# Patient Record
Sex: Male | Born: 1980 | Race: White | Hispanic: No | Marital: Married | Smoking: Never smoker
Health system: Southern US, Community
[De-identification: ages and names within clinical notes are randomized; demographics above are authoritative.]

## PROBLEM LIST (undated history)

## (undated) DIAGNOSIS — M7711 Lateral epicondylitis, right elbow: Secondary | ICD-10-CM

---
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
## 2019-01-01 ENCOUNTER — Ambulatory Visit (INDEPENDENT_AMBULATORY_CARE_PROVIDER_SITE_OTHER): Payer: BLUE CROSS/BLUE SHIELD | Admitting: Family Medicine

## 2019-01-01 ENCOUNTER — Ambulatory Visit (INDEPENDENT_AMBULATORY_CARE_PROVIDER_SITE_OTHER): Payer: Self-pay

## 2019-01-01 ENCOUNTER — Encounter (INDEPENDENT_AMBULATORY_CARE_PROVIDER_SITE_OTHER): Payer: Self-pay | Admitting: Family Medicine

## 2019-01-01 DIAGNOSIS — M25562 Pain in left knee: Secondary | ICD-10-CM | POA: Diagnosis not present

## 2019-01-01 DIAGNOSIS — M25561 Pain in right knee: Secondary | ICD-10-CM

## 2019-01-01 NOTE — Progress Notes (Signed)
   Office Visit Note   Patient: Bruce Braun           Date of Birth: 11-14-1981           MRN: [id number] Visit Date: 01/01/2019 Requested by: No referring provider defined for this encounter. PCP: Patient, No Pcp Per  Subjective: Chief Complaint  Patient presents with  . Right Knee - Pain    Knee started to hurt just a couple days ago, anterolateral aspect. NKI  . Left Knee - Pain    Pain since mid-December 2019 - woke him up middle of night. No specific injury. Got better with knee sleeve and Advil, but comes back every time he tries to work out.    HPI: He is a 38 year old with left greater than right knee pain.  His left knee started hurting in mid December.  He woke up one night with pain.  He did not have any pain the day before but since November he has been going to the gym trying to get in shape again.  He wonders whether some of his activities might have aggravated his knee.  He never had problems with his knee prior to this year.  His pain is mostly on the lateral aspect.  At rest he does not have any discomfort.  He has not noticed any swelling in the joint, locking or catching or popping.  It feels occasionally "wobbly".  His right knee started bothering him just a few days ago, minimal pain but he thought he would mention it.  It hurts on the anterior lateral aspect.              ROS: Otherwise noncontributory  Objective: Vital Signs: There were no vitals taken for this visit.  Physical Exam:  Right knee: No effusion, no patellofemoral crepitus.  Ligaments are stable.  Slight tenderness over the lateral/anterior joint line, no pain or click with McMurray's.  Left knee: No effusion, trace patellofemoral crepitus.  No pain with patella compression or apprehension.  Lockman's is solid, no laxity with varus/valgus stress.  Tender on the posterior lateral joint line with pain but no palpable click on McMurray's.   Imaging: X-rays both knees: Well-preserved joint spaces,  no definite stress fractures or loose bodies.  Possibly some very early lateral joint spurring the overall, bone structures look healthy.  Assessment & Plan: 1.  Left greater than right knee pain, possible lateral meniscus injuries. -Activity modification for the next 3 to 6 weeks.  If pain persists, then MRI of the left knee.  Otherwise follow-up as needed.   Follow-Up Instructions: No follow-ups on file.      Procedures: No procedures performed  No notes on file    PMFS History: There are no active problems to display for this patient.  History reviewed. No pertinent past medical history.  History reviewed. No pertinent family history.  History reviewed. No pertinent surgical history. Social History   Occupational History  . Not on file  Tobacco Use  . Smoking status: Never Smoker  . Smokeless tobacco: Never Used  Substance and Sexual Activity  . Alcohol use: Yes    Comment: 2 of any of the above per week  . Drug use: Yes    Types: Marijuana    Comment: infrequent  . Sexual activity: Not on file

## 2019-08-12 HISTORY — PX: WISDOM TOOTH EXTRACTION: SHX21

## 2019-10-13 ENCOUNTER — Other Ambulatory Visit: Payer: Self-pay

## 2019-10-13 DIAGNOSIS — Z20822 Contact with and (suspected) exposure to covid-19: Secondary | ICD-10-CM

## 2019-10-14 LAB — NOVEL CORONAVIRUS, NAA: SARS-CoV-2, NAA: NOT DETECTED

## 2021-08-11 DIAGNOSIS — Z8616 Personal history of COVID-19: Secondary | ICD-10-CM

## 2021-08-11 HISTORY — DX: Personal history of COVID-19: Z86.16

## 2021-08-16 ENCOUNTER — Other Ambulatory Visit: Payer: Self-pay | Admitting: Physician Assistant

## 2021-08-16 DIAGNOSIS — M25521 Pain in right elbow: Secondary | ICD-10-CM

## 2021-08-17 ENCOUNTER — Other Ambulatory Visit: Payer: Self-pay | Admitting: Physician Assistant

## 2021-08-17 ENCOUNTER — Other Ambulatory Visit: Payer: Self-pay

## 2021-08-17 ENCOUNTER — Ambulatory Visit
Admission: RE | Admit: 2021-08-17 | Discharge: 2021-08-17 | Disposition: A | Discharge status: Home / Self Care | Payer: BC Managed Care – PPO | Source: Ambulatory Visit | Attending: Physician Assistant | Admitting: Physician Assistant

## 2021-08-17 DIAGNOSIS — M25521 Pain in right elbow: Secondary | ICD-10-CM

## 2022-01-13 ENCOUNTER — Other Ambulatory Visit: Payer: Self-pay

## 2022-01-13 ENCOUNTER — Encounter (HOSPITAL_BASED_OUTPATIENT_CLINIC_OR_DEPARTMENT_OTHER): Payer: Self-pay | Admitting: Orthopedic Surgery

## 2022-01-13 NOTE — Progress Notes (Signed)
Spoke w/ via phone for pre-op interview--- pt Lab needs dos----  no (per anes)             Lab results------ no COVID test -----patient states asymptomatic no test needed Arrive at ------- 0745 on 01-19-2022 NPO after MN NO Solid Food.  Clear liquids from MN until--- 0645 Med rec completed Medications to take morning of surgery ----- none Diabetic medication ----- n/a Patient instructed no nail polish to be worn day of surgery Patient instructed to bring photo id and insurance card day of surgery Patient aware to have Driver (ride ) / caregiver for 24 hours after surgery ---wife, [place] Patient Special Instructions ----- n/a Pre-Op special Istructions ----- pre-op orders pending Patient verbalized understanding of instructions that were given at this phone interview. Patient denies shortness of breath, chest pain, fever, cough at this phone interview.

## 2022-01-16 NOTE — H&P (Signed)
Preoperative History & Physical Exam  Surgeon: Philipp Ovens, MD  Diagnosis: Right lateral epicondylitis  Planned Procedure: Procedure(s) (LRB): Right lateral epicondylitis debridement and repair (Right)  History of Present Illness:   Patient is a 41 y.o. male with symptoms consistent with Right lateral epicondylitis who presents for surgical intervention. The risks, benefits and alternatives of surgical intervention were discussed and informed consent was obtained prior to surgery.  Past Medical History:  Past Medical History:  Diagnosis Date   Epicondylitis, lateral, right    History of COVID-19 08/2021   per pt moderate symptoms that resolved    Past Surgical History:  Past Surgical History:  Procedure Laterality Date   TONSILLECTOMY  2001   WISDOM TOOTH EXTRACTION  08/2019    Medications:  Prior to Admission medications   Not on File    Allergies:  Patient has no known allergies.  Review of Systems: Negative except per HPI.  Physical Exam: Alert and oriented, NAD Head and neck: no masses, normal alignment CV: pulse intact Pulm: no increased work of breathing, respirations even and unlabored Abdomen: non-distended Extremities: extremities warm and well perfused  LABS: No results found for this or any previous visit (from the past 2160 hour(s)).   Complete History and Physical exam available in the office notes  Gomez Cleverly

## 2022-01-19 ENCOUNTER — Ambulatory Visit (HOSPITAL_BASED_OUTPATIENT_CLINIC_OR_DEPARTMENT_OTHER): Payer: BC Managed Care – PPO | Admitting: Anesthesiology

## 2022-01-19 ENCOUNTER — Encounter (HOSPITAL_BASED_OUTPATIENT_CLINIC_OR_DEPARTMENT_OTHER): Payer: Self-pay | Admitting: Orthopedic Surgery

## 2022-01-19 ENCOUNTER — Encounter (HOSPITAL_BASED_OUTPATIENT_CLINIC_OR_DEPARTMENT_OTHER)
Admission: RE | Disposition: A | Discharge status: Home / Self Care | Payer: Self-pay | Source: Home / Self Care | Attending: Orthopedic Surgery

## 2022-01-19 ENCOUNTER — Other Ambulatory Visit: Payer: Self-pay

## 2022-01-19 ENCOUNTER — Ambulatory Visit (HOSPITAL_BASED_OUTPATIENT_CLINIC_OR_DEPARTMENT_OTHER)
Admission: RE | Admit: 2022-01-19 | Discharge: 2022-01-19 | Disposition: A | Discharge status: Home / Self Care | Payer: BC Managed Care – PPO | Attending: Orthopedic Surgery | Admitting: Orthopedic Surgery

## 2022-01-19 DIAGNOSIS — M7711 Lateral epicondylitis, right elbow: Secondary | ICD-10-CM | POA: Diagnosis not present

## 2022-01-19 HISTORY — DX: Lateral epicondylitis, right elbow: M77.11

## 2022-01-19 HISTORY — PX: TENNIS ELBOW RELEASE/NIRSCHEL PROCEDURE: SHX6651

## 2022-01-19 SURGERY — TENNIS ELBOW RELEASE/NIRSCHEL PROCEDURE
Anesthesia: Monitor Anesthesia Care | Site: Elbow | Laterality: Right

## 2022-01-19 MED ORDER — ONDANSETRON HCL 4 MG/2ML IJ SOLN
INTRAMUSCULAR | Status: DC | PRN
  Administered 2022-01-19: 4 mg via INTRAVENOUS

## 2022-01-19 MED ORDER — FENTANYL CITRATE (PF) 100 MCG/2ML IJ SOLN
INTRAMUSCULAR | Status: DC | PRN
Start: 2022-01-19 — End: 2022-01-19
  Administered 2022-01-19: 50 ug via INTRAVENOUS

## 2022-01-19 MED ORDER — BUPIVACAINE HCL (PF) 0.5 % IJ SOLN
INTRAMUSCULAR | Status: DC | PRN
  Administered 2022-01-19: 10 mL

## 2022-01-19 MED ORDER — OXYCODONE HCL 5 MG PO TABS: 5.0000 mg | ORAL_TABLET | Freq: Once | ORAL | Status: DC | PRN

## 2022-01-19 MED ORDER — MIDAZOLAM HCL 2 MG/2ML IJ SOLN
INTRAMUSCULAR | Status: AC
  Filled 2022-01-19: qty 2

## 2022-01-19 MED ORDER — HYDROCODONE-ACETAMINOPHEN 5-325 MG PO TABS: 1.0000 | ORAL_TABLET | Freq: Four times a day (QID) | ORAL | 0 refills | Status: AC | PRN

## 2022-01-19 MED ORDER — PROPOFOL 10 MG/ML IV BOLUS
INTRAVENOUS | Status: DC | PRN
  Administered 2022-01-19: 40 mg via INTRAVENOUS
  Administered 2022-01-19: 20 mg via INTRAVENOUS

## 2022-01-19 MED ORDER — PROPOFOL 1000 MG/100ML IV EMUL
INTRAVENOUS | Status: AC
  Filled 2022-01-19: qty 100

## 2022-01-19 MED ORDER — ONDANSETRON HCL 4 MG/2ML IJ SOLN
INTRAMUSCULAR | Status: AC
  Filled 2022-01-19: qty 2

## 2022-01-19 MED ORDER — PROPOFOL 10 MG/ML IV BOLUS
INTRAVENOUS | Status: AC
  Filled 2022-01-19: qty 20

## 2022-01-19 MED ORDER — FENTANYL CITRATE (PF) 100 MCG/2ML IJ SOLN: 25.0000 ug | INTRAMUSCULAR | Status: DC | PRN

## 2022-01-19 MED ORDER — OXYCODONE HCL 5 MG/5ML PO SOLN: 5.0000 mg | Freq: Once | ORAL | Status: DC | PRN

## 2022-01-19 MED ORDER — CEFAZOLIN SODIUM-DEXTROSE 2-4 GM/100ML-% IV SOLN
2.0000 g | INTRAVENOUS | Status: AC
  Administered 2022-01-19: 2 g via INTRAVENOUS

## 2022-01-19 MED ORDER — MIDAZOLAM HCL 5 MG/5ML IJ SOLN
INTRAMUSCULAR | Status: DC | PRN
  Administered 2022-01-19: 1 mg via INTRAVENOUS

## 2022-01-19 MED ORDER — LIDOCAINE HCL (PF) 1 % IJ SOLN
INTRAMUSCULAR | Status: DC | PRN
  Administered 2022-01-19: 10 mL

## 2022-01-19 MED ORDER — KETOROLAC TROMETHAMINE 30 MG/ML IJ SOLN
INTRAMUSCULAR | Status: AC
  Filled 2022-01-19: qty 1

## 2022-01-19 MED ORDER — 0.9 % SODIUM CHLORIDE (POUR BTL) OPTIME
TOPICAL | Status: DC | PRN
  Administered 2022-01-19: 500 mL

## 2022-01-19 MED ORDER — KETOROLAC TROMETHAMINE 30 MG/ML IJ SOLN
INTRAMUSCULAR | Status: DC | PRN
Start: 2022-01-19 — End: 2022-01-19
  Administered 2022-01-19: 30 mg via INTRAVENOUS

## 2022-01-19 MED ORDER — LACTATED RINGERS IV SOLN: INTRAVENOUS | Status: DC

## 2022-01-19 MED ORDER — CEFAZOLIN SODIUM-DEXTROSE 2-4 GM/100ML-% IV SOLN
INTRAVENOUS | Status: AC
  Filled 2022-01-19: qty 100

## 2022-01-19 MED ORDER — FENTANYL CITRATE (PF) 100 MCG/2ML IJ SOLN
INTRAMUSCULAR | Status: AC
  Filled 2022-01-19: qty 2

## 2022-01-19 MED ORDER — ONDANSETRON HCL 4 MG/2ML IJ SOLN: 4.0000 mg | Freq: Once | INTRAMUSCULAR | Status: DC | PRN

## 2022-01-19 MED ORDER — PROPOFOL 500 MG/50ML IV EMUL
INTRAVENOUS | Status: DC | PRN
  Administered 2022-01-19: 75 ug/kg/min via INTRAVENOUS

## 2022-01-19 SURGICAL SUPPLY — 37 items
BLADE SURG 15 STRL LF DISP TIS (BLADE) ×1 IMPLANT
BLADE SURG 15 STRL SS (BLADE) ×1
BNDG ELASTIC 4X5.8 VLCR STR LF (GAUZE/BANDAGES/DRESSINGS) ×2 IMPLANT
BNDG ESMARK 4X9 LF (GAUZE/BANDAGES/DRESSINGS) ×2 IMPLANT
CORD BIPOLAR FORCEPS 12FT (ELECTRODE) ×1 IMPLANT
COVER BACK TABLE 60X90IN (DRAPES) ×2 IMPLANT
CUFF TOURN SGL QUICK 18X4 (TOURNIQUET CUFF) ×2 IMPLANT
DECANTER SPIKE VIAL GLASS SM (MISCELLANEOUS) IMPLANT
DERMABOND ADVANCED (GAUZE/BANDAGES/DRESSINGS) ×1
DERMABOND ADVANCED .7 DNX12 (GAUZE/BANDAGES/DRESSINGS) IMPLANT
DRAPE EXTREMITY T 121X128X90 (DISPOSABLE) ×2 IMPLANT
DRAPE SHEET LG 3/4 BI-LAMINATE (DRAPES) ×2 IMPLANT
DRSG EMULSION OIL 3X3 NADH (GAUZE/BANDAGES/DRESSINGS) ×2 IMPLANT
GAUZE 4X4 16PLY ~~LOC~~+RFID DBL (SPONGE) ×2 IMPLANT
GAUZE SPONGE 4X4 12PLY STRL (GAUZE/BANDAGES/DRESSINGS) ×2 IMPLANT
GAUZE SPONGE 4X4 12PLY STRL LF (GAUZE/BANDAGES/DRESSINGS) ×1 IMPLANT
GLOVE SURG UNDER POLY LF SZ7.5 (GLOVE) ×2 IMPLANT
GOWN STRL REUS W/TWL LRG LVL3 (GOWN DISPOSABLE) ×2 IMPLANT
K-WIRE .045X4 (WIRE) ×1 IMPLANT
KIT TURNOVER CYSTO (KITS) ×2 IMPLANT
NEEDLE HYPO 22GX1.5 SAFETY (NEEDLE) ×2 IMPLANT
NS IRRIG 500ML POUR BTL (IV SOLUTION) ×2 IMPLANT
PACK BASIN DAY SURGERY FS (CUSTOM PROCEDURE TRAY) ×2 IMPLANT
PAD CAST 4YDX4 CTTN HI CHSV (CAST SUPPLIES) ×2 IMPLANT
PADDING CAST COTTON 4X4 STRL (CAST SUPPLIES) ×1
STRIP CLOSURE SKIN 1/2X4 (GAUZE/BANDAGES/DRESSINGS) ×1 IMPLANT
SUT BONE WAX W31G (SUTURE) IMPLANT
SUT ETHILON 4 0 PS 2 18 (SUTURE) ×2 IMPLANT
SUT MNCRL AB 3-0 PS2 18 (SUTURE) ×1 IMPLANT
SUT VIC AB 3-0 FS2 27 (SUTURE) ×1 IMPLANT
SUT VIC AB 4-0 PS2 27 (SUTURE) ×2 IMPLANT
SYR 10ML LL (SYRINGE) ×1 IMPLANT
SYR BULB EAR ULCER 3OZ GRN STR (SYRINGE) ×2 IMPLANT
TAPE SURG TRANSPORE 1 IN (GAUZE/BANDAGES/DRESSINGS) ×1 IMPLANT
TAPE SURGICAL TRANSPORE 1 IN (GAUZE/BANDAGES/DRESSINGS) ×1
TOWEL OR 17X26 10 PK STRL BLUE (TOWEL DISPOSABLE) ×2 IMPLANT
UNDERPAD 30X36 HEAVY ABSORB (UNDERPADS AND DIAPERS) ×2 IMPLANT

## 2022-01-19 NOTE — Anesthesia Preprocedure Evaluation (Signed)
Anesthesia Evaluation  Patient identified by MRN, date of birth, ID band Patient awake    Reviewed: Allergy & Precautions, NPO status , Patient's Chart, lab work & pertinent test results  Airway Mallampati: II  TM Distance: >3 FB Neck ROM: Full    Dental no notable dental hx. (+) Teeth Intact   Pulmonary neg pulmonary ROS,    Pulmonary exam normal breath sounds clear to auscultation       Cardiovascular negative cardio ROS Normal cardiovascular exam Rhythm:Regular Rate:Normal     Neuro/Psych negative neurological ROS  negative psych ROS   GI/Hepatic negative GI ROS, Neg liver ROS,   Endo/Other  negative endocrine ROS  Renal/GU negative Renal ROS  negative genitourinary   Musculoskeletal  (+) Arthritis , Right lateral epicondylitis   Abdominal   Peds  Hematology negative hematology ROS (+)   Anesthesia Other Findings   Reproductive/Obstetrics                             Anesthesia Physical Anesthesia Plan  ASA: 1  Anesthesia Plan: MAC   Post-op Pain Management:    Induction: Intravenous  PONV Risk Score and Plan: 1 and Treatment may vary due to age or medical condition, Propofol infusion and Ondansetron  Airway Management Planned: Natural Airway, Simple Face Mask and Nasal Cannula  Additional Equipment:   Intra-op Plan:   Post-operative Plan:   Informed Consent: I have reviewed the patients History and Physical, chart, labs and discussed the procedure including the risks, benefits and alternatives for the proposed anesthesia with the patient or authorized representative who has indicated his/her understanding and acceptance.     Dental advisory given  Plan Discussed with: CRNA and Anesthesiologist  Anesthesia Plan Comments:         Anesthesia Quick Evaluation

## 2022-01-19 NOTE — Discharge Instructions (Signed)
°  Orthopaedic Hand Surgery Discharge Instructions ° °WEIGHT BEARING STATUS: Non weight bearing on operative extremity ° °INCISION CARE: Keep dressing over your incision clean and dry until 5 days after surgery. You may shower by placing a waterproof covering over your dressing. Once dressing is removed, you may allow water to run over the incision and then place Band-Aids over incision. Do not scrub your incision or apply creams/lotions. Do not submerge your incision or swim for 3 weeks after surgery. Contact your surgeon or primary care doctor if you develop redness or drainage from your incision.  ° °PAIN CONTROL: First line medications for post operative pain control are Tylenol (acetaminophen) and Motrin (ibuprofen) if you are able to take these medications. If you have been prescribed a medication these can be taken as breakthrough pain medications. Please note that some narcotic pain medication has acetaminophen added and you should never consume more than 4,000mg of acetaminophen in 24-hour period. Please note that if you are given Toradol (ketorolac) you should not take similar medications such as ibuprofen or naproxen. ° °DISCHARGE MEDICATIONS: If you have been prescribed medication it was sent electronically to your pharmacy. No changes have been made to your home medications. ° °ICE/ELEVATION: Ice and elevate your injured extremity as needed. Avoid direct contact of ice with skin.  ° °BANDAGE FEELS TOO TIGHT: If your bandage feels too tight, first make sure you are elevating your fingers as much as possible. The outer layer of the bandage can be unwrapped and reapplied more loosely. If no improvement, you may carefully cut the inner layer longitudinally until the pressure has resolved and then rewrap the outer layer. If you are not comfortable with these instructions, please call the office and the bandage can be changed for you.  ° °FOLLOW UP: You will be called after surgery with an appointment date and  time, however if you have not received a phone call within 3 days, please call during regular office hours at [phone] to schedule a post operative appointment. ° °Please Seek Medical Attention if: °Call MD for: pain or pressure in chest, jaw, arm, back, neck  °Call MD for: temperature greater than 101 F for more than 24 hrs °Call MD for: difficulty breathing °Call MD for: incision redness, bleeding, drainage  °Call MD for: palpitations or feeling that the heart is racing  °Call MD for: increased swelling in arm, leg, ankle, or abdomen  °Call MD for: lightheadedness, dizziness, fainting °Call 911 or go to ER for any medical emergency if you are not able to get in touch with your doctor ° ° °J. Reid Calyn Sivils, MD °Orthopaedic Hand Surgeon °[facility] °Office number: [phone] °[address] °[place], [place] [address] ° ° °

## 2022-01-19 NOTE — Anesthesia Postprocedure Evaluation (Signed)
Anesthesia Post Note  Patient: Bruce Braun  Procedure(s) Performed: Right lateral epicondylitis debridement and repair (Right: Elbow)     Patient location during evaluation: PACU Anesthesia Type: MAC Level of consciousness: awake and alert and oriented Pain management: pain level controlled Vital Signs Assessment: post-procedure vital signs reviewed and stable Respiratory status: spontaneous breathing, nonlabored ventilation and respiratory function stable Cardiovascular status: stable and blood pressure returned to baseline Postop Assessment: no apparent nausea or vomiting Anesthetic complications: no   No notable events documented.  Last Vitals:  Vitals:   01/19/22 0813 01/19/22 0930  BP: 121/76 114/80  Pulse: 79 73  Resp: 17 17  Temp: 36.8 C 36.6 C  SpO2: 98% 100%    Last Pain:  Vitals:   01/19/22 0930  TempSrc:   PainSc: 2                  Brithney Bensen,Lyncoln A.

## 2022-01-19 NOTE — Transfer of Care (Signed)
Immediate Anesthesia Transfer of Care Note  Patient: Bruce Braun  Procedure(s) Performed: Procedure(s) (LRB): Right lateral epicondylitis debridement and repair (Right)  Patient Location: PACU phase 2   Anesthesia Type: MAC  Level of Consciousness: awake, sedated, patient cooperative and responds to stimulation  Airway & Oxygen Therapy: Patient Spontanous Breathing and Patient on RA soft FM   Post-op Assessment: Report given to PACU RN, Post -op Vital signs reviewed and stable and Patient moving all extremities  Post vital signs: Reviewed and stable  Complications: No apparent anesthesia complications

## 2022-01-19 NOTE — Interval H&P Note (Signed)
History and Physical Interval Note:  01/19/2022 8:22 AM  Bruce Braun  has presented today for surgery, with the diagnosis of Right lateral epicondylitis.  The various methods of treatment have been discussed with the patient and family. After consideration of risks, benefits and other options for treatment, the patient has consented to  Procedure(s): Right lateral epicondylitis debridement and repair (Right) as a surgical intervention.  The patient's history has been reviewed, patient examined, no change in status, stable for surgery.  I have reviewed the patient's chart and labs.  Questions were answered to the patient's satisfaction.     Orene Desanctis

## 2022-01-19 NOTE — Op Note (Signed)
OPERATIVE NOTE  DATE OF PROCEDURE: 01/19/2022  SURGEONS:  Primary: Orene Desanctis, MD  PREOPERATIVE DIAGNOSIS: Right lateral epicondylitis  POSTOPERATIVE DIAGNOSIS: Same  NAME OF PROCEDURE:   Right elbow lateral epicondylitis debridement and repair  ANESTHESIA: Monitor Anesthesia Care + Local  SKIN PREPARATION: Hibiclens  ESTIMATED BLOOD LOSS: Minimal  IMPLANTS: none  INDICATIONS:  Bruce Braun is a 41 y.o. male who has the above preoperative diagnosis. The patient has decided to proceed with surgical intervention.  Risks, benefits and alternatives of operative management were discussed including, but not limited to, risks of anesthesia complications, infection, pain, persistent symptoms, stiffness, need for future surgery.  The patient understands, agrees and elects to proceed with surgery.    DESCRIPTION OF PROCEDURE: The patient was met in the pre-operative area and their identity was verified.  The operative location and laterality was also verified and marked.  The patient was brought to the OR and was placed supine on the table.  After repeat patient identification with the operative team anesthesia was provided and the patient was prepped and draped in the usual sterile fashion.  A final timeout was performed verifying the correction patient, procedure, location and laterality.  Preoperative antibiotics were provided and then the right upper extremity was elevated and exsanguinated with an Esmarch and tourniquet inflated to 250 mmHg.  An oblique incision was made over the lateral elbow just anterior to the prominence of the lateral condyle.  Skin and subcutaneous tissues were divided down to the fascia.  Careful hemostasis was obtained with bipolar electrocautery.  The interval between the extensor longus and extensor aponeurosis was identified and a longitudinal incision was made.  Careful dissection was made down to the extensor carpi radialis brevis.  There was significantly diseased,  friable, tendinotic tissue identified and this tissue was excised in its entirety.  This was sent to pathology as a specimen.  At this time the area just anterior to the lateral epicondyle was debrided with a curette to healthy cancellous bleeding bone and a 0.045 inch K wire was utilized to stimulate healing response in this area.  The wound was irrigated and was closed with buried 3-0 Vicryl suture in interrupted fashion with excellent watertight closure of the extensor interval.  The subcutaneous tissue was closed with 3-0 buried subcuticular Monocryl suture followed by Dermabond and Steri-Strips.  A sterile soft bandage was applied and the tourniquet was deflated.  The fingers were pink and warm and well-perfused at the end of the procedure.  All counts were correct x2.  The patient tolerated the procedure well and was brought to PACU for recovery in stable condition.  Begin therapy 1 week postop, wrist brace, postop lateral epi protocol. F/u path.   Bruce Holmes, MD

## 2022-01-20 ENCOUNTER — Encounter (HOSPITAL_BASED_OUTPATIENT_CLINIC_OR_DEPARTMENT_OTHER): Payer: Self-pay | Admitting: Orthopedic Surgery

## 2022-01-20 LAB — SURGICAL PATHOLOGY

## 2023-07-21 IMAGING — MR MR ELBOW*R* W/O CM
4 of 5 series · 18 of 40 positions shown · non-contrast
Comparison: None.

CLINICAL DATA: Lateral right elbow pain, tennis injury.

EXAM:
MRI OF THE RIGHT ELBOW WITHOUT CONTRAST
TECHNIQUE: Multiplanar, multisequence MR imaging of the elbow was performed. No
intravenous contrast was administered.

[Series 5: T1 · axial · right · 3.0mm · 0.16mm/px · z∈[-50,+23]mm · 3 of 27 slices shown]
[im 4/27]
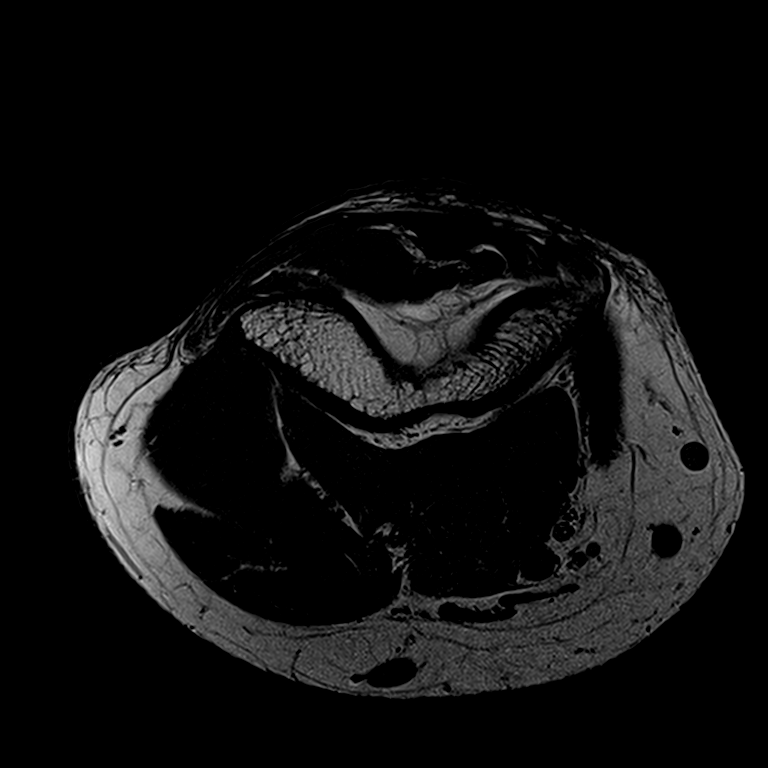
[im 14/27]
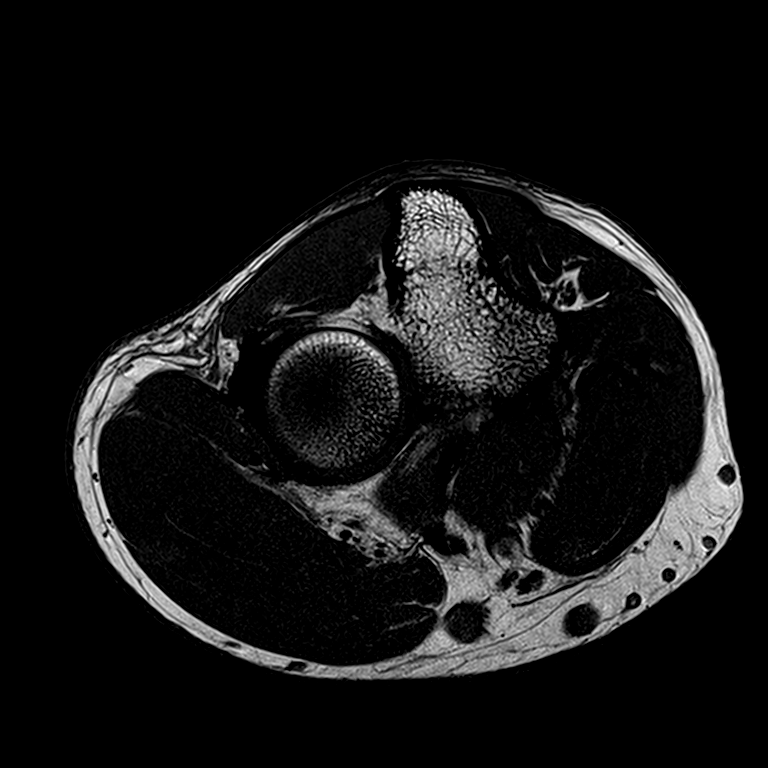
[im 23/27]
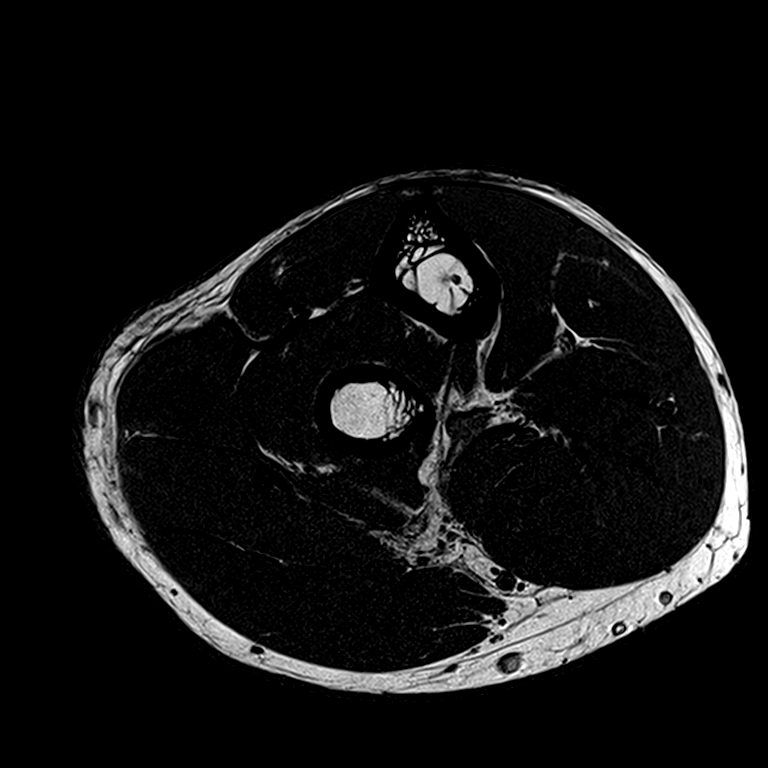

[Series 6: T2 fat-sat · axial · right · 3.0mm · 0.31mm/px · z∈[-61,+38]mm · 9 of 27 slices shown (1 of 3)]
[im 1/27]
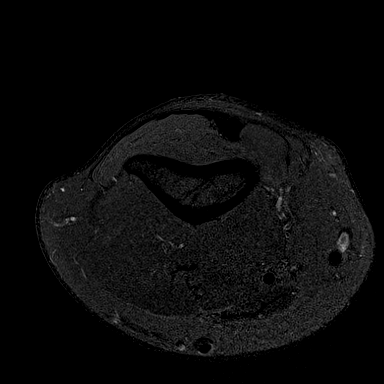
[im 4/27]
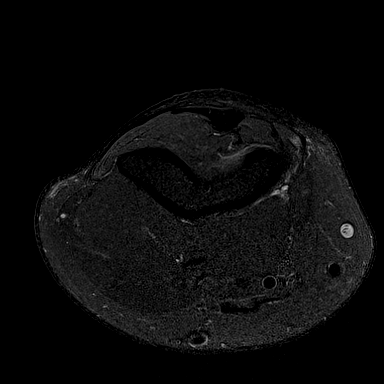
[im 7/27]
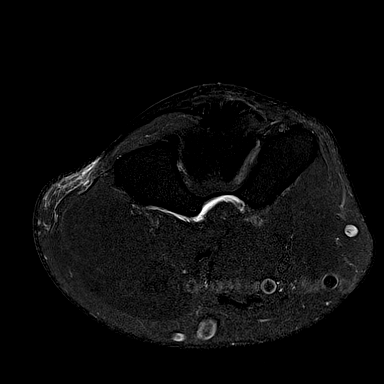
[im 10/27]
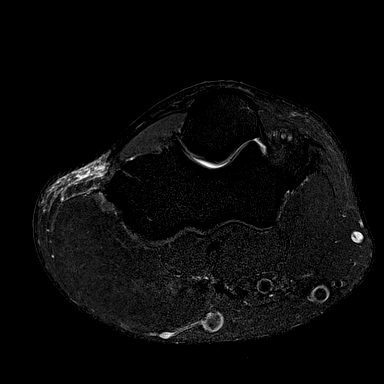
[im 14/27]
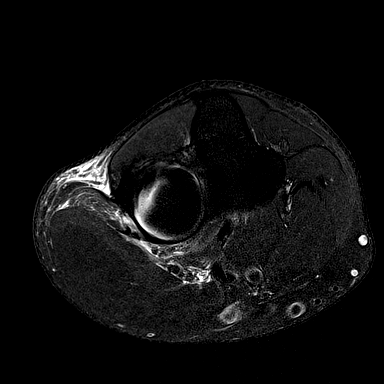
[im 17/27]
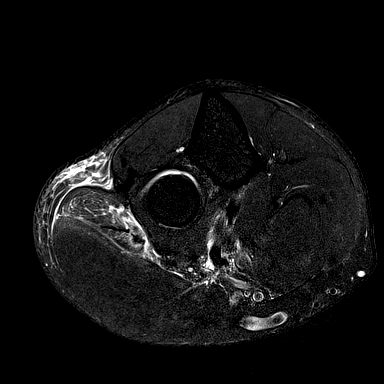
[im 20/27]
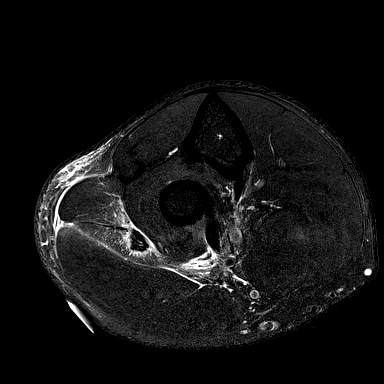
[im 23/27]
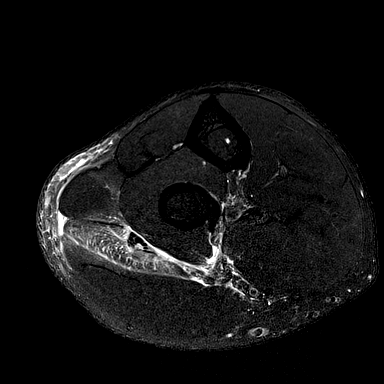
[im 27/27]
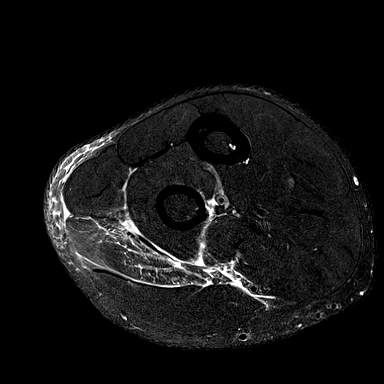

[Series 7: T2 fat-sat · coronal · right · 3.0mm · 0.22mm/px · 3 of 24 slices shown (2 of 3)]
[im 4/24]
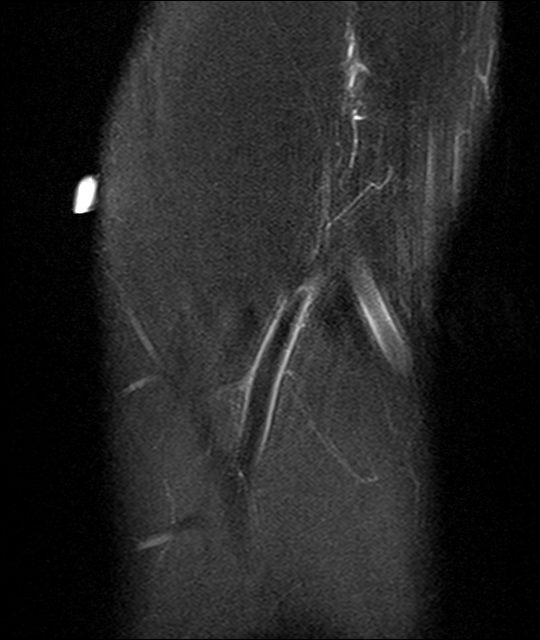
[im 12/24]
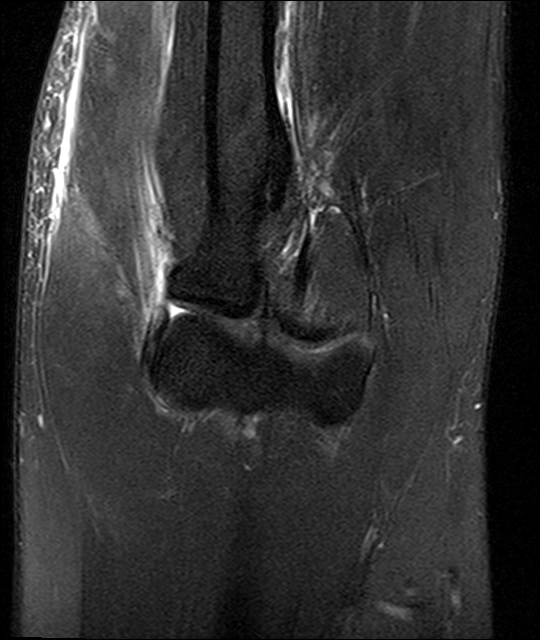
[im 20/24]
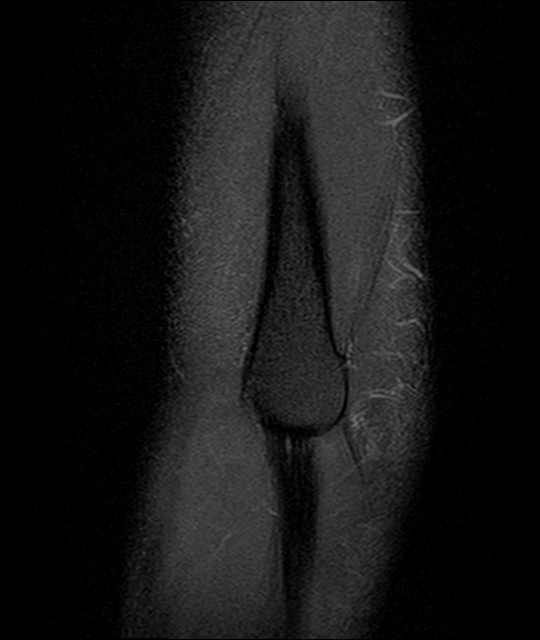

[Series 9: T2 fat-sat · sagittal · right · 3.0mm · 0.22mm/px · 3 of 27 slices shown (3 of 3)]
[im 4/27]
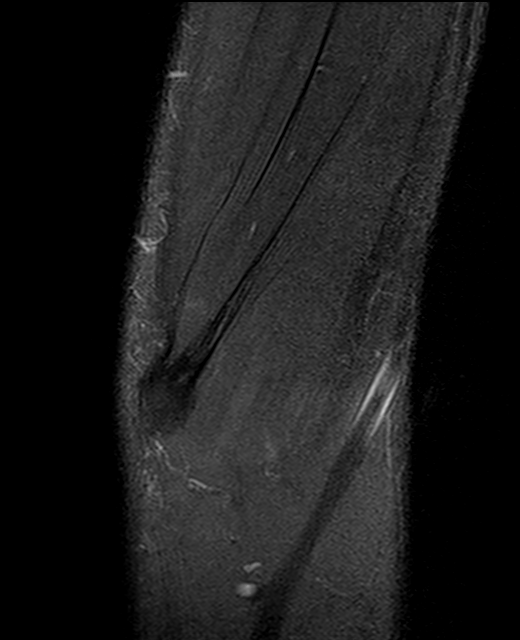
[im 15/27]
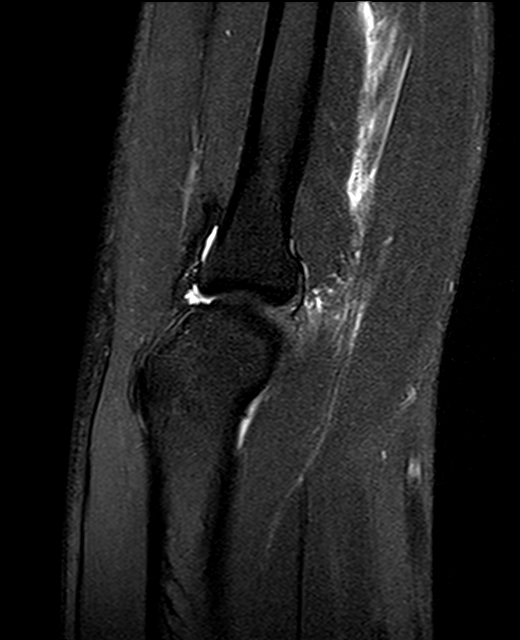
[im 23/27]
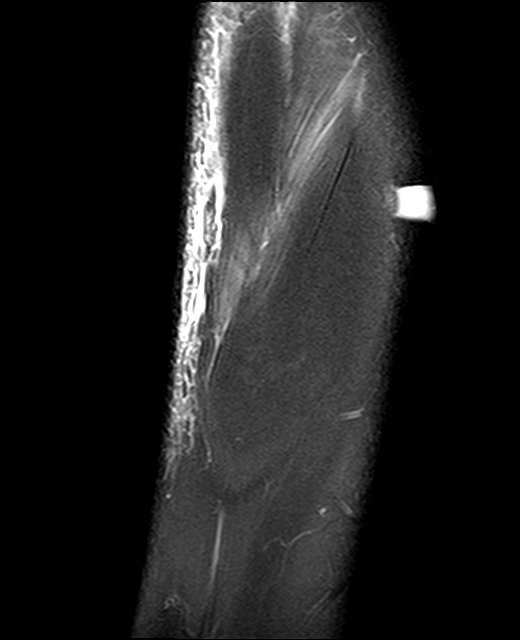

[18 of 40 positions shown; findings below may reference images not displayed]

FINDINGS: TENDONS

Common forearm flexor origin: Unremarkable

Common forearm extensor origin: Common extensor tendinopathy with
rupture or near rupture of the extensor carpi radialis longus tendon
which appears distally retracted by about 1 cm with the blunt and
somewhat serpentine proximal margin on image 10 of series 9, and
with diffuse edema proximally in the extensor carpi radialis longus
muscle. There is also substantial edema proximally in the extensor
carpi radialis brevis muscle and surrounding the ECRB tendon which
is probably partially torn proximally. Edema tracks along regional
fascia planes and in the overlying subcutaneous tissues.

Biceps: Unremarkable

Triceps: Unremarkable

LIGAMENTS

Medial stabilizers: Unremarkable

Lateral stabilizers:  Unremarkable

Cartilage: Intact

Joint: Unremarkable

Cubital tunnel: Unremarkable

Bones: No significant extra-articular osseous abnormalities
identified.
IMPRESSION: 1. Tearing of the common extensor tendon with rupture or nearly
complete rupture of the extensor carpi radialis longus tendon which
is retracted about 1 cm with a blunt in somewhat serpentine proximal
margin, and with diffuse edema in the proximal portion of the
muscle. There is also low-grade edema in the proximal extensor carpi
radialis brevis muscle with suspected partial tearing of the ECRB
portion of the common extensor tendon. Edema tracks along regional
fascia planes and in the overlying subcutaneous tissues. The radial
collateral ligament and lateral ulnar collateral ligament appear
intact.
